# Patient Record
Sex: Male | Born: 1995 | Race: White | Hispanic: No | Marital: Single | Smoking: Never smoker
Health system: Southern US, Community
[De-identification: ages and names within clinical notes are randomized; demographics above are authoritative.]

## PROBLEM LIST (undated history)

## (undated) DIAGNOSIS — S02411A LeFort I fracture, initial encounter for closed fracture: Secondary | ICD-10-CM

## (undated) DIAGNOSIS — Z789 Other specified health status: Secondary | ICD-10-CM

## (undated) DIAGNOSIS — S022XXA Fracture of nasal bones, initial encounter for closed fracture: Secondary | ICD-10-CM

## (undated) HISTORY — PX: CYST EXCISION: SHX5701

---
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
## 2017-04-01 ENCOUNTER — Encounter: Payer: Self-pay | Admitting: Gastroenterology

## 2017-05-05 ENCOUNTER — Ambulatory Visit: Payer: Self-pay | Admitting: Gastroenterology

## 2017-12-22 ENCOUNTER — Emergency Department (HOSPITAL_COMMUNITY): Payer: 59

## 2017-12-22 ENCOUNTER — Emergency Department (HOSPITAL_COMMUNITY)
Admission: EM | Admit: 2017-12-22 | Discharge: 2017-12-22 | Disposition: A | Discharge status: Home / Self Care | Payer: 59 | Attending: Emergency Medicine | Admitting: Emergency Medicine

## 2017-12-22 ENCOUNTER — Encounter (HOSPITAL_COMMUNITY): Payer: Self-pay | Admitting: *Deleted

## 2017-12-22 ENCOUNTER — Other Ambulatory Visit: Payer: Self-pay

## 2017-12-22 DIAGNOSIS — S0281XA Fracture of other specified skull and facial bones, right side, initial encounter for closed fracture: Secondary | ICD-10-CM | POA: Diagnosis not present

## 2017-12-22 DIAGNOSIS — S0990XA Unspecified injury of head, initial encounter: Secondary | ICD-10-CM | POA: Diagnosis present

## 2017-12-22 DIAGNOSIS — Y929 Unspecified place or not applicable: Secondary | ICD-10-CM | POA: Diagnosis not present

## 2017-12-22 DIAGNOSIS — Y999 Unspecified external cause status: Secondary | ICD-10-CM | POA: Insufficient documentation

## 2017-12-22 DIAGNOSIS — S0292XB Unspecified fracture of facial bones, initial encounter for open fracture: Secondary | ICD-10-CM

## 2017-12-22 DIAGNOSIS — S022XXA Fracture of nasal bones, initial encounter for closed fracture: Secondary | ICD-10-CM

## 2017-12-22 DIAGNOSIS — S0285XA Fracture of orbit, unspecified, initial encounter for closed fracture: Secondary | ICD-10-CM

## 2017-12-22 DIAGNOSIS — S0993XA Unspecified injury of face, initial encounter: Secondary | ICD-10-CM

## 2017-12-22 DIAGNOSIS — Y9389 Activity, other specified: Secondary | ICD-10-CM | POA: Diagnosis not present

## 2017-12-22 DIAGNOSIS — S02411A LeFort I fracture, initial encounter for closed fracture: Secondary | ICD-10-CM

## 2017-12-22 HISTORY — DX: Lefort i fracture, initial encounter for closed fracture: S02.411A

## 2017-12-22 HISTORY — DX: Fracture of nasal bones, initial encounter for closed fracture: S02.2XXA

## 2017-12-22 MED ORDER — FENTANYL CITRATE (PF) 100 MCG/2ML IJ SOLN
50.0000 ug | Freq: Once | INTRAMUSCULAR | Status: AC
  Administered 2017-12-22: 50 ug via INTRAVENOUS
  Filled 2017-12-22: qty 2

## 2017-12-22 MED ORDER — ONDANSETRON 4 MG PO TBDP
4.0000 mg | ORAL_TABLET | Freq: Once | ORAL | Status: AC
  Administered 2017-12-22: 4 mg via ORAL

## 2017-12-22 MED ORDER — HYDROCODONE-ACETAMINOPHEN 5-325 MG PO TABS: 1.0000 | ORAL_TABLET | Freq: Four times a day (QID) | ORAL | 0 refills | Status: DC | PRN

## 2017-12-22 MED ORDER — ONDANSETRON 8 MG PO TBDP: ORAL_TABLET | ORAL | 0 refills | Status: DC

## 2017-12-22 MED ORDER — ONDANSETRON 4 MG PO TBDP
ORAL_TABLET | ORAL | Status: AC
  Filled 2017-12-22: qty 1

## 2017-12-22 MED ORDER — OXYMETAZOLINE HCL 0.05 % NA SOLN
1.0000 | Freq: Once | NASAL | Status: AC
  Administered 2017-12-22: 1 via NASAL
  Filled 2017-12-22: qty 15

## 2017-12-22 MED ORDER — AMOXICILLIN-POT CLAVULANATE 875-125 MG PO TABS: 1.0000 | ORAL_TABLET | Freq: Two times a day (BID) | ORAL | 0 refills | Status: DC

## 2017-12-22 NOTE — ED Notes (Signed)
Patient transported to CT 

## 2017-12-22 NOTE — ED Provider Notes (Signed)
On the way out the door patient had an episode of vomiting.  He also had episode of epistaxis.  It is now controlled.  Zofran given.  Afrin given to patient.  He will follow with the oral surgery   Zadie RhineWickline, Elmus Mathes, MD 12/22/17 [phone]
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
## 2017-12-22 NOTE — Discharge Instructions (Signed)
As we discussed, you must undergo a non-chew diet.  You cannot blow your nose.  Please start your antibiotics.  Follow-up with Dr. Kenney Housemanrab this week
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
## 2017-12-22 NOTE — ED Notes (Signed)
Pt. Ambulated throughout the hall. Gait was good. No SOB or dizziness acknowledged by pt.

## 2017-12-24 ENCOUNTER — Ambulatory Visit: Payer: Self-pay | Admitting: Oral Surgery
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
## 2017-12-27 ENCOUNTER — Other Ambulatory Visit: Payer: Self-pay

## 2017-12-27 ENCOUNTER — Encounter (HOSPITAL_BASED_OUTPATIENT_CLINIC_OR_DEPARTMENT_OTHER): Payer: Self-pay | Admitting: *Deleted

## 2017-12-28 ENCOUNTER — Encounter (HOSPITAL_COMMUNITY): Payer: Self-pay | Admitting: *Deleted

## 2017-12-28 ENCOUNTER — Ambulatory Visit: Payer: Self-pay | Admitting: Oral Surgery

## 2017-12-28 ENCOUNTER — Other Ambulatory Visit: Payer: Self-pay

## 2017-12-28 MED ORDER — SCOPOLAMINE 1 MG/3DAYS TD PT72: 1.0000 | MEDICATED_PATCH | Freq: Once | TRANSDERMAL | Status: DC | PRN

## 2017-12-28 MED ORDER — FENTANYL CITRATE (PF) 100 MCG/2ML IJ SOLN: 50.0000 ug | INTRAMUSCULAR | Status: DC | PRN

## 2017-12-28 MED ORDER — MIDAZOLAM HCL 2 MG/2ML IJ SOLN: 1.0000 mg | INTRAMUSCULAR | Status: DC | PRN

## 2017-12-28 MED ORDER — LACTATED RINGERS IV SOLN
INTRAVENOUS | Status: DC
  Administered 2017-12-29 (×3): via INTRAVENOUS

## 2017-12-28 NOTE — Progress Notes (Signed)
I spoke with Vikki PortsValerie about missing orders from Dr. Kenney Housemanrab and she is looking into this.

## 2017-12-29 ENCOUNTER — Ambulatory Visit (HOSPITAL_COMMUNITY)
Admission: RE | Admit: 2017-12-29 | Discharge: 2017-12-29 | Disposition: A | Discharge status: Home / Self Care | Payer: 59 | Source: Ambulatory Visit | Attending: Oral Surgery | Admitting: Oral Surgery

## 2017-12-29 ENCOUNTER — Ambulatory Visit (HOSPITAL_BASED_OUTPATIENT_CLINIC_OR_DEPARTMENT_OTHER): Payer: 59 | Admitting: Certified Registered"

## 2017-12-29 ENCOUNTER — Encounter (HOSPITAL_COMMUNITY)
Admission: RE | Disposition: A | Discharge status: Home / Self Care | Payer: Self-pay | Source: Ambulatory Visit | Attending: Oral Surgery

## 2017-12-29 ENCOUNTER — Encounter (HOSPITAL_COMMUNITY): Payer: Self-pay | Admitting: *Deleted

## 2017-12-29 DIAGNOSIS — S02411B LeFort I fracture, initial encounter for open fracture: Secondary | ICD-10-CM | POA: Diagnosis not present

## 2017-12-29 DIAGNOSIS — Y92410 Unspecified street and highway as the place of occurrence of the external cause: Secondary | ICD-10-CM | POA: Diagnosis not present

## 2017-12-29 DIAGNOSIS — S0219XB Other fracture of base of skull, initial encounter for open fracture: Secondary | ICD-10-CM | POA: Insufficient documentation

## 2017-12-29 DIAGNOSIS — S0231XB Fracture of orbital floor, right side, initial encounter for open fracture: Secondary | ICD-10-CM | POA: Diagnosis not present

## 2017-12-29 DIAGNOSIS — S022XXB Fracture of nasal bones, initial encounter for open fracture: Secondary | ICD-10-CM | POA: Insufficient documentation

## 2017-12-29 DIAGNOSIS — S02411A LeFort I fracture, initial encounter for closed fracture: Secondary | ICD-10-CM | POA: Diagnosis present

## 2017-12-29 HISTORY — PX: ORIF NASAL FRACTURE: SHX5359

## 2017-12-29 HISTORY — DX: Fracture of nasal bones, initial encounter for closed fracture: S02.2XXA

## 2017-12-29 HISTORY — DX: Other specified health status: Z78.9

## 2017-12-29 HISTORY — PX: CLOSED REDUCTION NASAL FRACTURE: SHX5365

## 2017-12-29 HISTORY — DX: Lefort i fracture, initial encounter for closed fracture: S02.411A

## 2017-12-29 SURGERY — OPEN REDUCTION INTERNAL FIXATION (ORIF) NASAL FRACTURE
Anesthesia: General | Site: Mouth

## 2017-12-29 MED ORDER — FENTANYL CITRATE (PF) 100 MCG/2ML IJ SOLN: 25.0000 ug | INTRAMUSCULAR | Status: DC | PRN

## 2017-12-29 MED ORDER — LIDOCAINE-EPINEPHRINE 1 %-1:100000 IJ SOLN
INTRAMUSCULAR | Status: AC
  Filled 2017-12-29: qty 1

## 2017-12-29 MED ORDER — GELATIN ADSORBABLE OP FILM
ORAL_FILM | OPHTHALMIC | Status: AC
  Filled 2017-12-29: qty 1

## 2017-12-29 MED ORDER — OXYMETAZOLINE HCL 0.05 % NA SOLN
NASAL | Status: DC | PRN
  Administered 2017-12-29: 1

## 2017-12-29 MED ORDER — PROPOFOL 10 MG/ML IV BOLUS
INTRAVENOUS | Status: AC
  Filled 2017-12-29: qty 20

## 2017-12-29 MED ORDER — LACTATED RINGERS IV SOLN: INTRAVENOUS | Status: DC

## 2017-12-29 MED ORDER — DEXAMETHASONE SODIUM PHOSPHATE 10 MG/ML IJ SOLN
10.0000 mg | Freq: Once | INTRAMUSCULAR | Status: DC
  Filled 2017-12-29: qty 1

## 2017-12-29 MED ORDER — DEXMEDETOMIDINE HCL IN NACL 200 MCG/50ML IV SOLN
INTRAVENOUS | Status: DC | PRN
  Administered 2017-12-29 (×2): 8 ug via INTRAVENOUS

## 2017-12-29 MED ORDER — BACITRACIN ZINC 500 UNIT/GM EX OINT
TOPICAL_OINTMENT | CUTANEOUS | Status: AC
  Filled 2017-12-29: qty 56.7

## 2017-12-29 MED ORDER — AMOXICILLIN-POT CLAVULANATE 875-125 MG PO TABS: 1.0000 | ORAL_TABLET | Freq: Two times a day (BID) | ORAL | 0 refills | Status: AC

## 2017-12-29 MED ORDER — HYDROCODONE-ACETAMINOPHEN 5-325 MG PO TABS: 1.0000 | ORAL_TABLET | ORAL | 0 refills | Status: AC | PRN

## 2017-12-29 MED ORDER — SUGAMMADEX SODIUM 200 MG/2ML IV SOLN
INTRAVENOUS | Status: DC | PRN
  Administered 2017-12-29: 200 mg via INTRAVENOUS

## 2017-12-29 MED ORDER — BUPIVACAINE-EPINEPHRINE 0.25% -1:200000 IJ SOLN
INTRAMUSCULAR | Status: DC | PRN
  Administered 2017-12-29: 10 mL

## 2017-12-29 MED ORDER — PROPOFOL 10 MG/ML IV BOLUS
INTRAVENOUS | Status: DC | PRN
  Administered 2017-12-29: 150 mg via INTRAVENOUS

## 2017-12-29 MED ORDER — OXYMETAZOLINE HCL 0.05 % NA SOLN
NASAL | Status: AC
  Filled 2017-12-29: qty 15

## 2017-12-29 MED ORDER — DEXAMETHASONE SODIUM PHOSPHATE 10 MG/ML IJ SOLN
INTRAMUSCULAR | Status: DC | PRN
  Administered 2017-12-29: 10 mg via INTRAVENOUS

## 2017-12-29 MED ORDER — FENTANYL CITRATE (PF) 100 MCG/2ML IJ SOLN
INTRAMUSCULAR | Status: DC | PRN
  Administered 2017-12-29 (×7): 50 ug via INTRAVENOUS

## 2017-12-29 MED ORDER — OXYCODONE HCL 5 MG/5ML PO SOLN: 5.0000 mg | Freq: Once | ORAL | Status: AC | PRN

## 2017-12-29 MED ORDER — ROCURONIUM BROMIDE 50 MG/5ML IV SOSY
PREFILLED_SYRINGE | INTRAVENOUS | Status: DC | PRN
  Administered 2017-12-29: 20 mg via INTRAVENOUS
  Administered 2017-12-29 (×3): 10 mg via INTRAVENOUS
  Administered 2017-12-29: 50 mg via INTRAVENOUS

## 2017-12-29 MED ORDER — MIDAZOLAM HCL 2 MG/2ML IJ SOLN
INTRAMUSCULAR | Status: DC | PRN
  Administered 2017-12-29: 2 mg via INTRAVENOUS

## 2017-12-29 MED ORDER — BACITRACIN ZINC 500 UNIT/GM EX OINT
TOPICAL_OINTMENT | CUTANEOUS | Status: DC | PRN
  Administered 2017-12-29: 1 via TOPICAL

## 2017-12-29 MED ORDER — DEXMEDETOMIDINE HCL IN NACL 200 MCG/50ML IV SOLN
INTRAVENOUS | Status: AC
  Filled 2017-12-29: qty 50

## 2017-12-29 MED ORDER — PHENYLEPHRINE 40 MCG/ML (10ML) SYRINGE FOR IV PUSH (FOR BLOOD PRESSURE SUPPORT)
PREFILLED_SYRINGE | INTRAVENOUS | Status: DC | PRN
  Administered 2017-12-29: 40 ug via INTRAVENOUS

## 2017-12-29 MED ORDER — CHLORHEXIDINE GLUCONATE 0.12 % MT SOLN: 15.0000 mL | Freq: Three times a day (TID) | OROMUCOSAL | 3 refills | Status: DC

## 2017-12-29 MED ORDER — OXYCODONE HCL 5 MG PO TABS
5.0000 mg | ORAL_TABLET | Freq: Once | ORAL | Status: AC | PRN
  Administered 2017-12-29: 5 mg via ORAL

## 2017-12-29 MED ORDER — BUPIVACAINE-EPINEPHRINE (PF) 0.25% -1:200000 IJ SOLN
INTRAMUSCULAR | Status: AC
  Filled 2017-12-29: qty 30

## 2017-12-29 MED ORDER — DEXAMETHASONE SODIUM PHOSPHATE 10 MG/ML IJ SOLN
INTRAMUSCULAR | Status: AC
  Filled 2017-12-29: qty 1

## 2017-12-29 MED ORDER — LIDOCAINE-EPINEPHRINE 1 %-1:100000 IJ SOLN
INTRAMUSCULAR | Status: DC | PRN
  Administered 2017-12-29: 20 mL

## 2017-12-29 MED ORDER — LIDOCAINE 2% (20 MG/ML) 5 ML SYRINGE
INTRAMUSCULAR | Status: DC | PRN
  Administered 2017-12-29: 80 mg via INTRAVENOUS

## 2017-12-29 MED ORDER — GLYCOPYRROLATE 0.2 MG/ML IJ SOLN
INTRAMUSCULAR | Status: DC | PRN
  Administered 2017-12-29 (×2): 0.1 mg via INTRAVENOUS

## 2017-12-29 MED ORDER — MIDAZOLAM HCL 2 MG/2ML IJ SOLN
INTRAMUSCULAR | Status: AC
  Filled 2017-12-29: qty 2

## 2017-12-29 MED ORDER — OXYCODONE HCL 5 MG PO TABS
ORAL_TABLET | ORAL | Status: AC
  Filled 2017-12-29: qty 1

## 2017-12-29 MED ORDER — ONDANSETRON HCL 4 MG/2ML IJ SOLN
INTRAMUSCULAR | Status: AC
  Filled 2017-12-29: qty 2

## 2017-12-29 MED ORDER — CEFAZOLIN SODIUM-DEXTROSE 2-4 GM/100ML-% IV SOLN
2.0000 g | INTRAVENOUS | Status: AC
  Administered 2017-12-29: 2 g via INTRAVENOUS
  Filled 2017-12-29: qty 100

## 2017-12-29 MED ORDER — ROCURONIUM BROMIDE 10 MG/ML (PF) SYRINGE
PREFILLED_SYRINGE | INTRAVENOUS | Status: AC
  Filled 2017-12-29: qty 10

## 2017-12-29 MED ORDER — LIDOCAINE 2% (20 MG/ML) 5 ML SYRINGE
INTRAMUSCULAR | Status: AC
  Filled 2017-12-29: qty 5

## 2017-12-29 MED ORDER — ONDANSETRON HCL 4 MG/2ML IJ SOLN
INTRAMUSCULAR | Status: DC | PRN
  Administered 2017-12-29: 4 mg via INTRAVENOUS

## 2017-12-29 MED ORDER — FENTANYL CITRATE (PF) 250 MCG/5ML IJ SOLN
INTRAMUSCULAR | Status: AC
  Filled 2017-12-29: qty 5

## 2017-12-29 MED ORDER — 0.9 % SODIUM CHLORIDE (POUR BTL) OPTIME
TOPICAL | Status: DC | PRN
  Administered 2017-12-29: 1000 mL

## 2017-12-29 SURGICAL SUPPLY — 59 items
BIT DRILL TWIST 1.3X5 (BIT) ×2
BIT DRILL TWIST 1.3X5MM (BIT) ×2 IMPLANT
BLADE SURG 15 STRL LF DISP TIS (BLADE) IMPLANT
BLADE SURG 15 STRL SS (BLADE)
CANISTER SUCT 3000ML PPV (MISCELLANEOUS) ×4 IMPLANT
CLEANER TIP ELECTROSURG 2X2 (MISCELLANEOUS) ×4 IMPLANT
DRAPE HALF SHEET 40X57 (DRAPES) IMPLANT
DRILL BIT TWIST 1.3X5MM (BIT) ×2
ELECT COATED BLADE 2.86 ST (ELECTRODE) ×4 IMPLANT
ELECT NEEDLE TIP 2.8 STRL (NEEDLE) IMPLANT
ELECT REM PT RETURN 9FT ADLT (ELECTROSURGICAL) ×4
ELECTRODE REM PT RTRN 9FT ADLT (ELECTROSURGICAL) ×2 IMPLANT
GLOVE ORTHO TXT STRL SZ7.5 (GLOVE) ×8 IMPLANT
GOWN STRL REUS W/ TWL LRG LVL3 (GOWN DISPOSABLE) ×4 IMPLANT
GOWN STRL REUS W/TWL LRG LVL3 (GOWN DISPOSABLE) ×4
KIT BASIN OR (CUSTOM PROCEDURE TRAY) ×4 IMPLANT
KIT SPLINT NASAL DENVER SM BEI (GAUZE/BANDAGES/DRESSINGS) ×4 IMPLANT
KIT TURNOVER KIT B (KITS) ×4 IMPLANT
NEEDLE 27GAX1X1/2 (NEEDLE) IMPLANT
NS IRRIG 1000ML POUR BTL (IV SOLUTION) ×4 IMPLANT
PAD ARMBOARD 7.5X6 YLW CONV (MISCELLANEOUS) ×8 IMPLANT
PATTIES SURGICAL .5 X3 (DISPOSABLE) ×4 IMPLANT
PENCIL BUTTON HOLSTER BLD 10FT (ELECTRODE) ×4 IMPLANT
PLATE MID FACE 3D 2X2 (Plate) ×4 IMPLANT
PLATE MID FACE 5H 4MM BAR (Plate) ×4 IMPLANT
PLATE Y BAR 5H 8MM (Plate) ×8 IMPLANT
PROTECTOR CORNEAL (OPHTHALMIC RELATED) IMPLANT
SCISSORS WIRE DISP (INSTRUMENTS) IMPLANT
SCREW MIDFACE 1.7X4 SLF DRILL (Screw) ×16 IMPLANT
SCREW MIDFACE 1.7X5 SLF DRILL (Screw) ×40 IMPLANT
SCREW MIDFACE 1.9X5MM (Screw) ×12 IMPLANT
SCREW UPPER FACE 2.0X12MM (Screw) ×16 IMPLANT
SCREW UPPER FACE 2.0X8MM (Screw) ×8 IMPLANT
SPLINT NASAL DOYLE BI-VL (GAUZE/BANDAGES/DRESSINGS) ×4 IMPLANT
STAPLER VISISTAT 35W (STAPLE) IMPLANT
SUT BONE WAX W31G (SUTURE) IMPLANT
SUT CHROMIC 3 0 PS 2 (SUTURE) ×16 IMPLANT
SUT CHROMIC 4 0 PS 2 18 (SUTURE) IMPLANT
SUT CHROMIC 5 0 RB 1 27 (SUTURE) IMPLANT
SUT ETHILON 3 0 FSL (SUTURE) ×4 IMPLANT
SUT ETHILON 4 0 P 3 18 (SUTURE) IMPLANT
SUT ETHILON 5 0 P 3 18 (SUTURE)
SUT NYLON ETHILON 5-0 P-3 1X18 (SUTURE) IMPLANT
SUT PLAIN 5 0 P 3 18 (SUTURE) IMPLANT
SUT PROLENE 5 0 RB 2 (SUTURE) ×4 IMPLANT
SUT SILK 2 0 PERMA HAND 18 BK (SUTURE) ×8 IMPLANT
SUT SILK 3 0 (SUTURE)
SUT SILK 3-0 18XBRD TIE 12 (SUTURE) IMPLANT
SUT STEEL 0 (SUTURE)
SUT STEEL 0 18XMFL TIE 17 (SUTURE) IMPLANT
SUT STEEL 2 (SUTURE) ×4 IMPLANT
SUT VIC AB 3-0 FS2 27 (SUTURE) IMPLANT
SUT VIC AB 4-0 P-3 18X BRD (SUTURE) IMPLANT
SUT VIC AB 4-0 P3 18 (SUTURE)
SUT VIC AB 5-0 P-3 18XBRD (SUTURE) IMPLANT
SUT VIC AB 5-0 P3 18 (SUTURE)
TOWEL OR 17X24 6PK STRL BLUE (TOWEL DISPOSABLE) ×4 IMPLANT
TRAY ENT MC OR (CUSTOM PROCEDURE TRAY) ×4 IMPLANT
WATER STERILE IRR 1000ML POUR (IV SOLUTION) ×4 IMPLANT

## 2017-12-29 NOTE — Transfer of Care (Signed)
Immediate Anesthesia Transfer of Care Note  Patient: Shaun Gamble  Procedure(s) Performed: OPEN REDUCTION INTERNAL FIXATION (ORIF) LEFORT (N/A Mouth) CLOSED REDUCTION NASAL FRACTURE (N/A )  Patient Location: PACU  Anesthesia Type:General  Level of Consciousness: awake, alert  and oriented  Airway & Oxygen Therapy: Patient Spontanous Breathing and Patient connected to face mask oxygen  Post-op Assessment: Report given to RN and Post -op Vital signs reviewed and stable  Post vital signs: Reviewed and stable  Last Vitals:  Vitals Value Taken Time  BP 144/81 12/29/2017  6:21 PM  Temp    Pulse 93 12/29/2017  6:26 PM  Resp 19 12/29/2017  6:26 PM  SpO2 96 % 12/29/2017  6:26 PM  Vitals shown include unvalidated device data.  Last Pain:  Vitals:   12/29/17 1240  TempSrc:   PainSc: 8          Complications: No apparent anesthesia complications

## 2017-12-29 NOTE — Discharge Instructions (Addendum)
1. Strict Soft NON-CHEW diet until further notice. 2. Sinus precautions to include no nose blowing, no straws, no smoking, open mouth sneezes. 3. Clean submental wound with H2O2:water 50:50 soln three times daily with q-tip applicator. Apply thin coat of bacitracin ointment following to keep moist.

## 2017-12-29 NOTE — Brief Op Note (Signed)
12/29/2017  6:10 PM  PATIENT:  Shaun MoralesHolden R Gamble  21 y.o. male  PRE-OPERATIVE DIAGNOSIS:  LE FORT 1 FRACTURE, NASAL BONE FRACTURES, SEPTUM FRACTURE  POST-OPERATIVE DIAGNOSIS:  BONE FRACTURES, SEPTUM FRACTURE  PROCEDURE:  Procedure(s): OPEN REDUCTION INTERNAL FIXATION (ORIF) LEFORT (N/A) CLOSED REDUCTION NASAL FRACTURE (N/A)  SUBMENTAL INTUBATION  SURGEON:  Surgeon(s) and Role:    * Kaybree Williams, DMD - Primary  ANESTHESIA:   general  EBL:  40 mL   BLOOD ADMINISTERED:none  DRAINS: none   LOCAL MEDICATIONS USED:  LIDOCAINE   SPECIMEN:  No Specimen  DISPOSITION OF SPECIMEN:  N/A  COUNTS:  YES  TOURNIQUET:  * No tourniquets in log *  DICTATION: .Dragon Dictation  PLAN OF CARE: Discharge to home after PACU  PATIENT DISPOSITION:  PACU - hemodynamically stable.   Delay start of Pharmacological VTE agent (>24hrs) due to surgical blood loss or risk of bleeding: not applicable

## 2017-12-29 NOTE — Anesthesia Procedure Notes (Signed)
Procedure Name: Intubation Date/Time: 12/29/2017 3:13 PM Performed by: Pearson Grippeobertson, Rheagan Nayak M, CRNA Pre-anesthesia Checklist: Patient identified, Emergency Drugs available, Suction available and Patient being monitored Patient Re-evaluated:Patient Re-evaluated prior to induction Oxygen Delivery Method: Circle system utilized Preoxygenation: Pre-oxygenation with 100% oxygen Induction Type: IV induction Ventilation: Mask ventilation without difficulty Laryngoscope Size: Miller and 2 Grade View: Grade I Tube type: Oral Tube size: 7.5 mm Number of attempts: 1 Airway Equipment and Method: Stylet Placement Confirmation: ETT inserted through vocal cords under direct vision,  positive ETCO2 and breath sounds checked- equal and bilateral Secured at: 23 cm Tube secured with: Tape Dental Injury: Teeth and Oropharynx as per pre-operative assessment

## 2017-12-29 NOTE — Anesthesia Preprocedure Evaluation (Signed)
Anesthesia Evaluation  Patient identified by MRN, date of birth, ID band Patient awake    Reviewed: Allergy & Precautions, NPO status , Patient's Chart, lab work & pertinent test results  History of Anesthesia Complications Negative for: history of anesthetic complications  Airway Mallampati: III  TM Distance: >3 FB Neck ROM: Full  Mouth opening: Limited Mouth Opening  Dental  (+) Teeth Intact   Pulmonary neg pulmonary ROS,    breath sounds clear to auscultation       Cardiovascular negative cardio ROS   Rhythm:Regular     Neuro/Psych negative neurological ROS  negative psych ROS   GI/Hepatic negative GI ROS, Neg liver ROS,   Endo/Other  negative endocrine ROS  Renal/GU negative Renal ROS     Musculoskeletal   Abdominal   Peds  Hematology negative hematology ROS (+)   Anesthesia Other Findings LE FORT 1 FRACTURE, NASAL BONE FRACTURES, SEPTUM FRACTURE  Reproductive/Obstetrics                             Anesthesia Physical Anesthesia Plan  ASA: I  Anesthesia Plan: General   Post-op Pain Management:    Induction: Intravenous  PONV Risk Score and Plan: 2 and Ondansetron and Dexamethasone  Airway Management Planned: Oral ETT  Additional Equipment: None  Intra-op Plan:   Post-operative Plan: Extubation in OR  Informed Consent: I have reviewed the patients History and Physical, chart, labs and discussed the procedure including the risks, benefits and alternatives for the proposed anesthesia with the patient or authorized representative who has indicated his/her understanding and acceptance.   Dental advisory given  Plan Discussed with: CRNA and Surgeon  Anesthesia Plan Comments:         Anesthesia Quick Evaluation

## 2017-12-29 NOTE — Interval H&P Note (Signed)
History and Physical Interval Note:  12/29/2017 2:51 PM  Shaun Gamble  has presented today for surgery, with the diagnosis of LE FORT 1 FRACTURE, NASAL BONE FRACTURES, SEPTUM FRACTURE  The various methods of treatment have been discussed with the patient and family. After consideration of risks, benefits and other options for treatment, the patient has consented to  Procedure(s): OPEN REDUCTION INTERNAL FIXATION (ORIF) LEFORT (N/A) CLOSED REDUCTION NASAL FRACTURE (N/A) as a surgical intervention .  The patient's history has been reviewed, patient examined, no change in status, stable for surgery.  I have reviewed the patient's chart and labs.  Questions were answered to the patient's satisfaction.     Jill AlexandersJustin Miangel Flom

## 2017-12-30 ENCOUNTER — Encounter (HOSPITAL_COMMUNITY): Payer: Self-pay | Admitting: Oral Surgery
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
## 2017-12-30 NOTE — Anesthesia Postprocedure Evaluation (Signed)
Anesthesia Post Note  Patient: Shaun MoralesHolden R Gamble  Procedure(s) Performed: OPEN REDUCTION INTERNAL FIXATION (ORIF) LEFORT (N/A Mouth) CLOSED REDUCTION NASAL FRACTURE (N/A )     Patient location during evaluation: PACU Anesthesia Type: General Level of consciousness: awake and alert Pain management: pain level controlled Vital Signs Assessment: post-procedure vital signs reviewed and stable Respiratory status: spontaneous breathing, nonlabored ventilation, respiratory function stable and patient connected to nasal cannula oxygen Cardiovascular status: blood pressure returned to baseline and stable Postop Assessment: no apparent nausea or vomiting Anesthetic complications: no    Last Vitals:  Vitals:   12/29/17 1921 12/29/17 1937  BP: (!) 154/90 136/86  Pulse: 91 97  Resp: 14 18  Temp: 37.6 C   SpO2: 96% 96%    Last Pain:  Vitals:   12/29/17 1937  TempSrc:   PainSc: 5                  Muhammed Teutsch S

## 2019-07-18 ENCOUNTER — Other Ambulatory Visit: Payer: Self-pay

## 2020-08-25 ENCOUNTER — Other Ambulatory Visit: Payer: Self-pay

## 2020-08-25 ENCOUNTER — Emergency Department (HOSPITAL_COMMUNITY)
Admission: EM | Admit: 2020-08-25 | Discharge: 2020-08-25 | Disposition: A | Discharge status: Home / Self Care | Payer: 59 | Attending: Emergency Medicine | Admitting: Emergency Medicine

## 2020-08-25 ENCOUNTER — Encounter (HOSPITAL_COMMUNITY): Payer: Self-pay

## 2020-08-25 ENCOUNTER — Emergency Department (HOSPITAL_COMMUNITY): Payer: 59

## 2020-08-25 DIAGNOSIS — S61215A Laceration without foreign body of left ring finger without damage to nail, initial encounter: Secondary | ICD-10-CM | POA: Insufficient documentation

## 2020-08-25 DIAGNOSIS — Y92810 Car as the place of occurrence of the external cause: Secondary | ICD-10-CM | POA: Insufficient documentation

## 2020-08-25 DIAGNOSIS — Z23 Encounter for immunization: Secondary | ICD-10-CM | POA: Insufficient documentation

## 2020-08-25 DIAGNOSIS — S6992XA Unspecified injury of left wrist, hand and finger(s), initial encounter: Secondary | ICD-10-CM | POA: Diagnosis present

## 2020-08-25 DIAGNOSIS — W499XXA Exposure to other inanimate mechanical forces, initial encounter: Secondary | ICD-10-CM | POA: Insufficient documentation

## 2020-08-25 MED ORDER — TETANUS-DIPHTH-ACELL PERTUSSIS 5-2.5-18.5 LF-MCG/0.5 IM SUSY
0.5000 mL | PREFILLED_SYRINGE | Freq: Once | INTRAMUSCULAR | Status: AC
  Administered 2020-08-25: 0.5 mL via INTRAMUSCULAR
  Filled 2020-08-25: qty 0.5

## 2020-08-25 MED ORDER — LIDOCAINE HCL (PF) 1 % IJ SOLN
5.0000 mL | Freq: Once | INTRAMUSCULAR | Status: AC
  Administered 2020-08-25: 5 mL
  Filled 2020-08-25: qty 5

## 2020-08-25 NOTE — Progress Notes (Signed)
Orthopedic Tech Progress Note Patient Details:  Shaun Gamble 12-Jun-1995 [id number]  Ortho Devices Type of Ortho Device: Finger splint Ortho Device/Splint Location: lue ring finger Ortho Device/Splint Interventions: Application,Adjustment,Ordered   Post Interventions Patient Tolerated: Well Instructions Provided: Care of device,Adjustment of device   [name] 08/25/2020, 10:01 PM

## 2020-08-25 NOTE — ED Notes (Signed)
Ortho tech called 

## 2020-08-25 NOTE — ED Triage Notes (Signed)
Patient complains of small laceration to left hand ring finger after cutting while working on car, saline dressing applied and no bleeding

## 2020-08-25 NOTE — Discharge Instructions (Addendum)
You have a laceration on the top of your left ring finger.  X-ray did not show any injury to the bone or foreign bodies.  On exam you had slight weakness with extension of your finger.  It is possible that this is just from pain or inflammation from the wound.  However, I cannot completely rule out a possible injury of your tendon.  You have some range of motion so I suspect if it is injured it is very mild and will likely heal on its own.  You need to call Dr. Aundria Rud and make an appointment for an ER follow-up visit for a wound check.  He will determined the next step in treatment.    Keep original dressing on for the next 24 hours.  After this you may remove your splint and dressing and wash the wound with clean water and antibacterial soap at least morning and evening.  Apply thin layer of antibiotic ointment over the wound and cover with dressing.  Stitches need to be removed in 7 days.  You should wear your splint at all times unless bathing or cleaning the wound.  This is to protect the wound as well as maintain good alignment in case there is injury to the tendon.  Return to the ED for worsening pain, redness, swelling, pus, fevers

## 2020-08-25 NOTE — ED Provider Notes (Signed)
[facility] EMERGENCY DEPARTMENT Provider Note   CSN: [id number] Arrival date & time: 08/25/20  1628     History No chief complaint on file.   Shaun Gamble is a 25 y.o. male to the ED for evaluation of accidental injury to the left dorsal ring finger.  He was working on a car when he got his finger stuck.  He sustained a laceration over his left proximal IP joint.  Reports local pain.  No distal tingling.  Unknown last tetanus status.  Ceiling dressing was applied in triage and wound has stopped bleeding.  He is right-hand dominant.  HPI     Past Medical History:  Diagnosis Date  . Jerry Caras I fracture of maxilla (HCC) 12/22/2017  . Medical history non-contributory   . Nasal fracture 12/22/2017  . Nasal septum fracture 12/22/2017    There are no problems to display for this patient.   Past Surgical History:  Procedure Laterality Date  . CLOSED REDUCTION NASAL FRACTURE N/A 12/29/2017   Procedure: CLOSED REDUCTION NASAL FRACTURE;  Surgeon: Vivia Ewing, DMD;  Location: [facility] OR;  Service: Oral Surgery;  Laterality: N/A;  . CYST EXCISION Right    axilla  . ORIF NASAL FRACTURE N/A 12/29/2017   Procedure: OPEN REDUCTION INTERNAL FIXATION (ORIF) LEFORT;  Surgeon: Vivia Ewing, DMD;  Location: [facility] OR;  Service: Oral Surgery;  Laterality: N/A;       No family history on file.  Social History   Tobacco Use  . Smoking status: Never Smoker  . Smokeless tobacco: Never Used  Vaping Use  . Vaping Use: Never used  Substance Use Topics  . Alcohol use: Yes    Alcohol/week: 2.0 standard drinks    Types: 2 Cans of beer per week    Comment: occasionally  . Drug use: No    Home Medications Prior to Admission medications   Medication Sig Start Date End Date Taking? Authorizing Provider  amoxicillin-clavulanate (AUGMENTIN) 875-125 MG tablet Take 1 tablet by mouth 2 (two) times daily. One po bid x 7 days Patient taking differently: Take 1 tablet by mouth 2 (two)  times daily. For 7 days 12/22/17   Zadie Rhine, MD  chlorhexidine (PERIDEX) 0.12 % solution Use as directed 15 mLs in the mouth or throat 3 (three) times daily. Rinse with 58mL orally three times daily for a duration of 2 minutes, expectorate after use. 12/29/17   Drab, Jill Alexanders, DMD  HYDROcodone-acetaminophen (NORCO/VICODIN) 5-325 MG tablet Take 1 tablet by mouth every 6 (six) hours as needed for severe pain. 12/22/17   Zadie Rhine, MD  ibuprofen (ADVIL,MOTRIN) 200 MG tablet Take 600 mg by mouth every 6 (six) hours as needed for headache or moderate pain.     [provider]    Allergies    Patient has no known allergies.  Review of Systems   Review of Systems  Skin: Positive for wound.  All other systems reviewed and are negative.   Physical Exam Updated Vital Signs BP 129/82 (BP Location: Left Arm)   Pulse 86   Temp 98.6 F (37 C)   Resp 16   SpO2 100%   Physical Exam Constitutional:      Appearance: He is well-developed.  HENT:     Head: Normocephalic.     Nose: Nose normal.  Eyes:     General: Lids are normal.  Cardiovascular:     Rate and Rhythm: Normal rate.     Comments: Cap refill normal in the  left ring fingertip Pulmonary:     Effort: Pulmonary effort is normal. No respiratory distress.  Musculoskeletal:        General: Normal range of motion.     Cervical back: Normal range of motion.     Comments: Range of motion inspection of wound done before and after local anesthetic.  Subtle decreased strength against resistance with extension at the PIP noted.  Wound is small and I am unable to affirmatively visualize tendon injury despite irrigation and inspection of the wound with active range of motion, question partial laceration across pearly-gray structure.  No other deficits noted at the DIP or MCP.  Skin:    Comments: 2 cm laceration directly over left ring finger PIP.  Locally tender.  Hemostatic.  No obvious debris  Neurological:     Mental  Status: He is alert.     Comments: Sensation to light touch intact in finger tip (left ring finger)  Psychiatric:        Behavior: Behavior normal.     ED Results / Procedures / Treatments   Labs (all labs ordered are listed, but only abnormal results are displayed) Labs Reviewed - No data to display  EKG None  Radiology DG Finger Ring Left  Result Date: 08/25/2020 CLINICAL DATA:  Laceration over dorsal IP joint. EXAM: LEFT RING FINGER 2+V COMPARISON:  None. FINDINGS: There is no evidence of fracture or dislocation. There is no evidence of arthropathy or other focal bone abnormality. Soft tissues are unremarkable. IMPRESSION: Negative. Electronically Signed   By: Signa Kell M.D.   On: 08/25/2020 20:19    Procedures Procedures   Medications Ordered in ED Medications  lidocaine (PF) (XYLOCAINE) 1 % injection 5 mL (has no administration in time range)  Tdap (BOOSTRIX) injection 0.5 mL (0.5 mLs Intramuscular Given 08/25/20 2043)    ED Course  I have reviewed the triage vital signs and the nursing notes.  Pertinent labs & imaging results that were available during my care of the patient were reviewed by me and considered in my medical decision making (see chart for details).    MDM Rules/Calculators/A&P                          25 year old male presents to the ED for laceration over dorsal left ring finger PIP.  Examination reveals subtle decreased strength against resistance with extension at the PIP, exam done both before and after local anesthetic.  There is questionable tendon injury.  Wound edges approximated with 2 sutures.  He was placed in a protective splint.  Fingers neurovascularly intact otherwise.  X-ray ordered does not reveal any foreign body or involvement of the bones.  Discussed with patient he will need repeat evaluation by hand surgery to determine if there was injury to the tendon.  Wound care instructions discussed.  Return precautions discussed.  Patient  and mother in agreement with plan of care.  Tetanus given.   Final Clinical Impression(s) / ED Diagnoses  Final diagnoses:  Laceration of left ring finger without foreign body without damage to nail, initial encounter    Rx / DC Orders ED Discharge Orders    None       Jerrell Mylar 08/25/20 2156    Terrilee Files, MD 08/26/20 1025

## 2020-08-25 NOTE — ED Notes (Signed)
Pt washed wound for 5 minutes with running water and soap.

## 2021-04-23 ENCOUNTER — Encounter: Payer: Self-pay | Admitting: Emergency Medicine

## 2021-04-23 ENCOUNTER — Other Ambulatory Visit: Payer: Self-pay

## 2021-04-23 ENCOUNTER — Ambulatory Visit
Admission: EM | Admit: 2021-04-23 | Discharge: 2021-04-23 | Disposition: A | Discharge status: Home / Self Care | Payer: 59 | Attending: Family Medicine | Admitting: Family Medicine

## 2021-04-23 DIAGNOSIS — J069 Acute upper respiratory infection, unspecified: Secondary | ICD-10-CM

## 2021-04-23 DIAGNOSIS — J111 Influenza due to unidentified influenza virus with other respiratory manifestations: Secondary | ICD-10-CM

## 2021-04-23 MED ORDER — PROMETHAZINE-DM 6.25-15 MG/5ML PO SYRP: 5.0000 mL | ORAL_SOLUTION | Freq: Four times a day (QID) | ORAL | 0 refills | Status: AC | PRN

## 2021-04-23 MED ORDER — OSELTAMIVIR PHOSPHATE 75 MG PO CAPS: 75.0000 mg | ORAL_CAPSULE | Freq: Two times a day (BID) | ORAL | 0 refills | Status: AC

## 2021-04-23 NOTE — ED Provider Notes (Signed)
[facility]   [id number] 04/23/21 Arrival Time: 1911  ASSESSMENT & PLAN:  1. Viral URI with cough   2. Influenza-like illness    Discussed typical duration of viral illnesses. COVID-19/influenza testing sent.  Meds ordered this encounter  Medications   oseltamivir (TAMIFLU) 75 MG capsule    Sig: Take 1 capsule (75 mg total) by mouth 2 (two) times daily for 5 days.    Dispense:  10 capsule    Refill:  0   promethazine-dextromethorphan (PROMETHAZINE-DM) 6.25-15 MG/5ML syrup    Sig: Take 5 mLs by mouth 4 (four) times daily as needed for cough.    Dispense:  118 mL    Refill:  0     Follow-up Information     Assunta Found, MD.   Specialty: Family Medicine Why: As needed. Contact information: [address] [place] [address] [phone]                 Reviewed expectations re: course of current medical issues. Questions answered. Outlined signs and symptoms indicating need for more acute intervention. Understanding verbalized. After Visit Summary given.   SUBJECTIVE: History from: patient. Shaun Gamble is a 25 y.o. male who reports: fever and non-prod cough; x 2 days. Scratchy throat. Denies: difficulty breathing. Normal PO intake without n/v/d.   OBJECTIVE:  Vitals:   04/23/21 1959  BP: 130/79  Pulse: (!) 113  Resp: 18  Temp: 100.1 F (37.8 C)  TempSrc: Oral  SpO2: 96%    General appearance: alert; no distress Eyes: PERRLA; EOMI; conjunctiva normal HENT: [place]; AT; with nasal congestion Neck: supple  Lungs: speaks full sentences without difficulty; unlabored; clear Extremities: no edema Skin: warm and dry Neurologic: normal gait Psychological: alert and cooperative; normal mood and affect  Labs:  Labs Reviewed  COVID-19, FLU A+B NAA   No Known Allergies  Past Medical History:  Diagnosis Date   Jerry Caras I fracture of maxilla (HCC) 12/22/2017   Medical history non-contributory    Nasal fracture 12/22/2017   Nasal  septum fracture 12/22/2017   Social History   Socioeconomic History   Marital status: Single    Spouse name: Not on file   Number of children: Not on file   Years of education: Not on file   Highest education level: Not on file  Occupational History   Not on file  Tobacco Use   Smoking status: Never   Smokeless tobacco: Never  Vaping Use   Vaping Use: Never used  Substance and Sexual Activity   Alcohol use: Yes    Alcohol/week: 2.0 standard drinks    Types: 2 Cans of beer per week    Comment: occasionally   Drug use: No   Sexual activity: Not on file  Other Topics Concern   Not on file  Social History Narrative   Not on file   Social Determinants of Health   Financial Resource Strain: Not on file  Food Insecurity: Not on file  Transportation Needs: Not on file  Physical Activity: Not on file  Stress: Not on file  Social Connections: Not on file  Intimate Partner Violence: Not on file   History reviewed. No pertinent family history. Past Surgical History:  Procedure Laterality Date   CLOSED REDUCTION NASAL FRACTURE N/A 12/29/2017   Procedure: CLOSED REDUCTION NASAL FRACTURE;  Surgeon: Vivia Ewing, DMD;  Location: [facility] OR;  Service: Oral Surgery;  Laterality: N/A;   CYST EXCISION Right    axilla   ORIF NASAL  FRACTURE N/A 12/29/2017   Procedure: OPEN REDUCTION INTERNAL FIXATION (ORIF) LEFORT;  Surgeon: Vivia Ewing, DMD;  Location: [facility] OR;  Service: Oral Surgery;  Laterality: N/AMardella Layman, MD 04/23/21 2023

## 2021-04-23 NOTE — ED Triage Notes (Signed)
Patient c/o fever and nonproductive cough x 2 days.   Patient endorses a temperature of 101.6 F at home. Patient endorses the highest temperature was 102 F at home.   Patient endorses itchy throat.   Patient has taken Mucinex with no relief of symptoms.

## 2021-04-26 LAB — COVID-19, FLU A+B NAA
Influenza A, NAA: DETECTED — AB
Influenza B, NAA: NOT DETECTED
SARS-CoV-2, NAA: NOT DETECTED

## 2021-09-08 IMAGING — DX DG FINGER RING 2+V*L*
3 series · 3 of 3 positions shown · non-contrast
Comparison: None.

CLINICAL DATA: Laceration over dorsal IP joint.

EXAM:
LEFT RING FINGER 2+V

[finger ap]
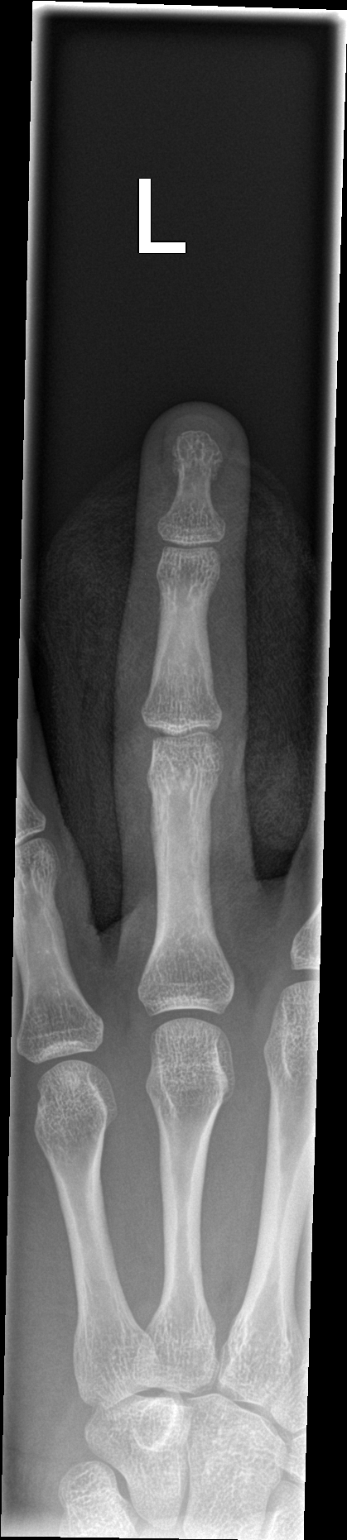

[finger obl]
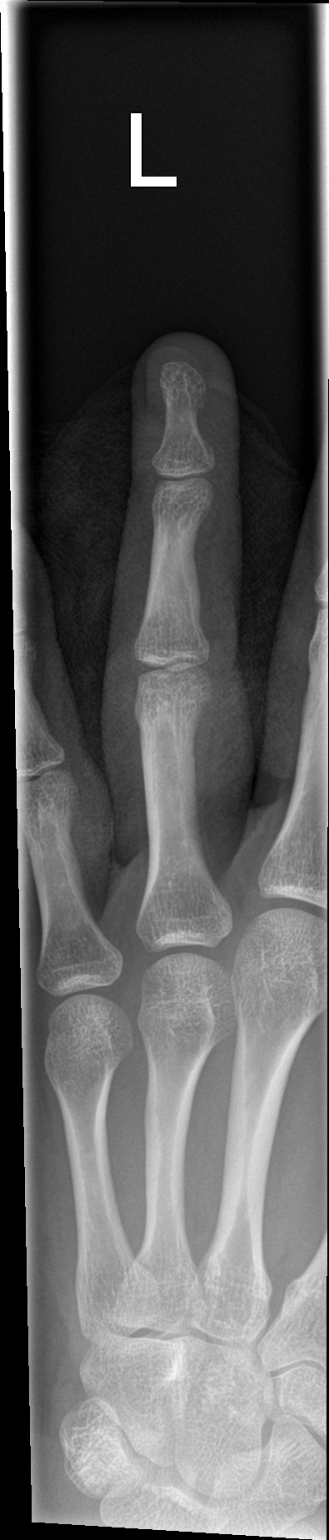

[finger lat]
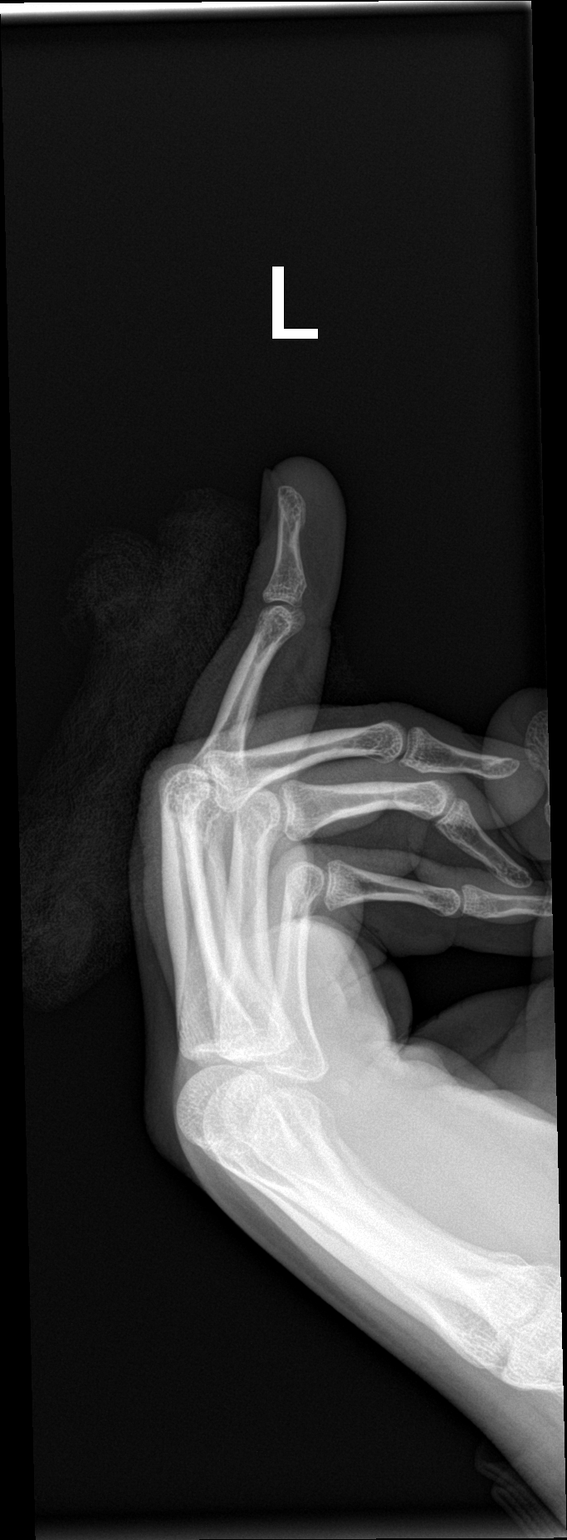

[3 of 3 positions shown; findings below may reference images not displayed]

FINDINGS: There is no evidence of fracture or dislocation. There is no
evidence of arthropathy or other focal bone abnormality. Soft
tissues are unremarkable.
IMPRESSION: Negative.

## 2023-10-02 ENCOUNTER — Ambulatory Visit
Admission: EM | Admit: 2023-10-02 | Discharge: 2023-10-02 | Disposition: A | Discharge status: Home / Self Care | Attending: Family Medicine | Admitting: Family Medicine

## 2023-10-02 DIAGNOSIS — R0781 Pleurodynia: Secondary | ICD-10-CM

## 2023-10-02 MED ORDER — KETOROLAC TROMETHAMINE 30 MG/ML IJ SOLN
30.0000 mg | Freq: Once | INTRAMUSCULAR | Status: AC
  Administered 2023-10-02: 30 mg via INTRAMUSCULAR

## 2023-10-02 MED ORDER — TIZANIDINE HCL 4 MG PO CAPS: 4.0000 mg | ORAL_CAPSULE | Freq: Three times a day (TID) | ORAL | 0 refills | Status: AC | PRN

## 2023-10-02 NOTE — ED Triage Notes (Addendum)
 Pt reports he was playing golf x 1 week ago and now has right sided rib pain. States pain has not been consistent.

## 2023-10-05 NOTE — ED Provider Notes (Signed)
 [facility]    CSN: [id number] Arrival date & time: 10/02/23  1439      History   Chief Complaint No chief complaint on file.   HPI Shaun Gamble is a 28 y.o. male.   Presenting today with 1 week history of right sided rib pain and spasm with movement. States pain initially started while golfing, seemed to ease after a few days but came back worse after golfing again. Denies bruising, swelling, redness, rashes, CP, SOB, abdominal pain, N/V/D, diaphoresis. So far trying OTC pain relievers with minimal relief.     Past Medical History:  Diagnosis Date   Madelin Schatz I fracture of maxilla [facility]) 12/22/2017   Medical history non-contributory    Nasal fracture 12/22/2017   Nasal septum fracture 12/22/2017    There are no active problems to display for this patient.   Past Surgical History:  Procedure Laterality Date   CLOSED REDUCTION NASAL FRACTURE N/A 12/29/2017   Procedure: CLOSED REDUCTION NASAL FRACTURE;  Surgeon: Joseph Nickel, DMD;  Location: [facility] OR;  Service: Oral Surgery;  Laterality: N/A;   CYST EXCISION Right    axilla   ORIF NASAL FRACTURE N/A 12/29/2017   Procedure: OPEN REDUCTION INTERNAL FIXATION (ORIF) LEFORT;  Surgeon: Joseph Nickel, DMD;  Location: [facility] OR;  Service: Oral Surgery;  Laterality: N/A;       Home Medications    Prior to Admission medications   Medication Sig Start Date End Date Taking? Authorizing Provider  tiZANidine (ZANAFLEX) 4 MG capsule Take 1 capsule (4 mg total) by mouth 3 (three) times daily as needed for muscle spasms. Do not drink alcohol or drive while taking this medication.  May cause drowsiness. 10/02/23  Yes Corbin Dess, PA-C  promethazine -dextromethorphan (PROMETHAZINE -DM) 6.25-15 MG/5ML syrup Take 5 mLs by mouth 4 (four) times daily as needed for cough. 04/23/21   Afton Albright, MD    Family History History reviewed. No pertinent family history.  Social History Social History   Tobacco Use   Smoking status:  Never   Smokeless tobacco: Never  Vaping Use   Vaping status: Never Used  Substance Use Topics   Alcohol use: Yes    Alcohol/week: 2.0 standard drinks of alcohol    Types: 2 Cans of beer per week    Comment: occasionally   Drug use: No     Allergies   Patient has no known allergies.   Review of Systems Review of Systems PER HPI  Physical Exam Triage Vital Signs ED Triage Vitals  Encounter Vitals Group     BP 10/02/23 1449 135/84     Systolic BP Percentile --      Diastolic BP Percentile --      Pulse Rate 10/02/23 1449 (!) 58     Resp 10/02/23 1449 16     Temp 10/02/23 1449 97.8 F (36.6 C)     Temp Source 10/02/23 1449 Oral     SpO2 10/02/23 1449 98 %     Weight --      Height --      Head Circumference --      Peak Flow --      Pain Score 10/02/23 1450 8     Pain Loc --      Pain Education --      Exclude from Growth Chart --    No data found.  Updated Vital Signs BP 135/84 (BP Location: Right Arm)   Pulse (!) 58   Temp 97.8 F (  36.6 C) (Oral)   Resp 16   SpO2 98%   Visual Acuity Right Eye Distance:   Left Eye Distance:   Bilateral Distance:    Right Eye Near:   Left Eye Near:    Bilateral Near:     Physical Exam Vitals and nursing note reviewed.  Constitutional:      Appearance: Normal appearance.  HENT:     Head: Atraumatic.  Eyes:     Extraocular Movements: Extraocular movements intact.     Conjunctiva/sclera: Conjunctivae normal.  Cardiovascular:     Rate and Rhythm: Normal rate and regular rhythm.  Pulmonary:     Effort: Pulmonary effort is normal.     Breath sounds: Normal breath sounds.  Musculoskeletal:        General: Tenderness present. No swelling or deformity. Normal range of motion.     Cervical back: Normal range of motion and neck supple.     Comments: Right lateral rib ttp without palpable deformity or bruising. Chest rise symmetric b/l  Skin:    General: Skin is warm and dry.     Findings: No bruising or erythema.   Neurological:     General: No focal deficit present.     Mental Status: He is oriented to person, place, and time.  Psychiatric:        Mood and Affect: Mood normal.        Thought Content: Thought content normal.        Judgment: Judgment normal.      UC Treatments / Results  Labs (all labs ordered are listed, but only abnormal results are displayed) Labs Reviewed - No data to display  EKG   Radiology No results found.  Procedures Procedures (including critical care time)  Medications Ordered in UC Medications  ketorolac (TORADOL) 30 MG/ML injection 30 mg (30 mg Intramuscular Given 10/02/23 1520)    Initial Impression / Assessment and Plan / UC Course  I have reviewed the triage vital signs and the nursing notes.  Pertinent labs & imaging results that were available during my care of the patient were reviewed by me and considered in my medical decision making (see chart for details).     Vitals and exam very reassuring, x-ray imaging deferred with shared decision making given very low suspicion for bony injury. Suspect rib strain. Treat with IM toradol, zanaflex, heat, massage, stretches, rest. Return for worsening sxs.  Final Clinical Impressions(s) / UC Diagnoses   Final diagnoses:  Rib pain on right side   Discharge Instructions   None    ED Prescriptions     Medication Sig Dispense Auth. Provider   tiZANidine (ZANAFLEX) 4 MG capsule Take 1 capsule (4 mg total) by mouth 3 (three) times daily as needed for muscle spasms. Do not drink alcohol or drive while taking this medication.  May cause drowsiness. 15 capsule Corbin Dess, [place]      PDMP not reviewed this encounter.   Corbin Dess, [place] 10/05/23 2111
